# Patient Record
Sex: Female | Born: 1945 | Race: White | Marital: Married
Health system: Southern US, Community
[De-identification: ages and names within clinical notes are randomized; demographics above are authoritative.]

---

## 2017-03-27 ENCOUNTER — Other Ambulatory Visit: Payer: Self-pay | Admitting: Neurosurgery

## 2017-03-27 DIAGNOSIS — M48061 Spinal stenosis, lumbar region without neurogenic claudication: Secondary | ICD-10-CM

## 2017-04-11 ENCOUNTER — Other Ambulatory Visit: Payer: Self-pay

## 2017-04-13 ENCOUNTER — Ambulatory Visit
Admission: RE | Admit: 2017-04-13 | Discharge: 2017-04-13 | Disposition: A | Discharge status: Home / Self Care | Payer: Medicare Other | Source: Ambulatory Visit | Attending: Neurosurgery | Admitting: Neurosurgery

## 2017-04-13 DIAGNOSIS — M48061 Spinal stenosis, lumbar region without neurogenic claudication: Secondary | ICD-10-CM

## 2017-04-13 MED ORDER — ONDANSETRON HCL 4 MG/2ML IJ SOLN: 4.0000 mg | Freq: Four times a day (QID) | INTRAMUSCULAR | Status: DC | PRN

## 2017-04-13 MED ORDER — IOPAMIDOL (ISOVUE-M 200) INJECTION 41%
15.0000 mL | Freq: Once | INTRAMUSCULAR | Status: AC
  Administered 2017-04-13: 15 mL via INTRATHECAL

## 2017-04-13 MED ORDER — DIAZEPAM 5 MG PO TABS
5.0000 mg | ORAL_TABLET | Freq: Once | ORAL | Status: AC
  Administered 2017-04-13: 5 mg via ORAL

## 2017-04-13 NOTE — Discharge Instructions (Signed)
Myelogram Discharge Instructions  1. Go home and rest quietly for the next 24 hours.  It is important to lie flat for the next 24 hours.  Get up only to go to the restroom.  You may lie in the bed or on a couch on your back, your stomach, your left side or your right side.  You may have one pillow under your head.  You may have pillows between your knees while you are on your side or under your knees while you are on your back.  2. DO NOT drive today.  Recline the seat as far back as it will go, while still wearing your seat belt, on the way home.  3. You may get up to go to the bathroom as needed.  You may sit up for 10 minutes to eat.  You may resume your normal diet and medications unless otherwise indicated.  Drink lots of extra fluids today and tomorrow.  4. The incidence of headache, nausea, or vomiting is about 5% (one in 20 patients).  If you develop a headache, lie flat and drink plenty of fluids until the headache goes away.  Caffeinated beverages may be helpful.  If you develop severe nausea and vomiting or a headache that does not go away with flat bed rest, call [phone].  5. You may resume normal activities after your 24 hours of bed rest is over; however, do not exert yourself strongly or do any heavy lifting tomorrow. If when you get up you have a headache when standing, go back to bed and force fluids for another 24 hours.  6. Call your physician for a follow-up appointment.  The results of your myelogram will be sent directly to your physician by the following day.  7. If you have any questions or if complications develop after you arrive home, please call [phone].  Discharge instructions have been explained to the patient.  The patient, or the person responsible for the patient, fully understands these instructions.       May resume [facility] on April 14, 2017, after 1:00 pm.

## 2018-10-05 IMAGING — CT CT L SPINE W/ CM
1 of 6 series · 6 of 14 positions shown, 8 images · non-contrast
Comparison: Outside lumbar spine MRI 02/16/2017

CLINICAL DATA: Low back pain.
TECHNIQUE: Contiguous axial images were obtained through the Lumbar spine after
the intrathecal infusion of infusion. Coronal and sagittal
reconstructions were obtained of the axial image sets.

[Series 3: l spine soft · axial · 0.28mm/px · z∈[-290,-116]mm · 6 of 82 slices shown, 8 images]
[im 12/82  soft-tissue]
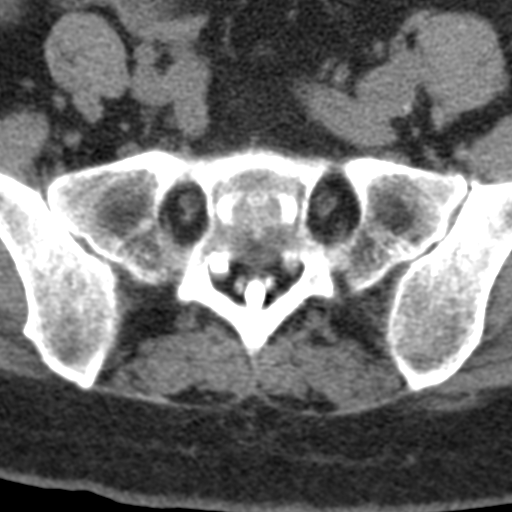
[im 12/82  bone]
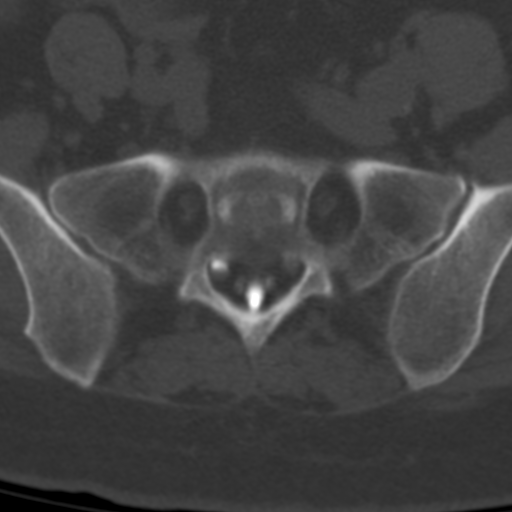
[im 24/82  bone]
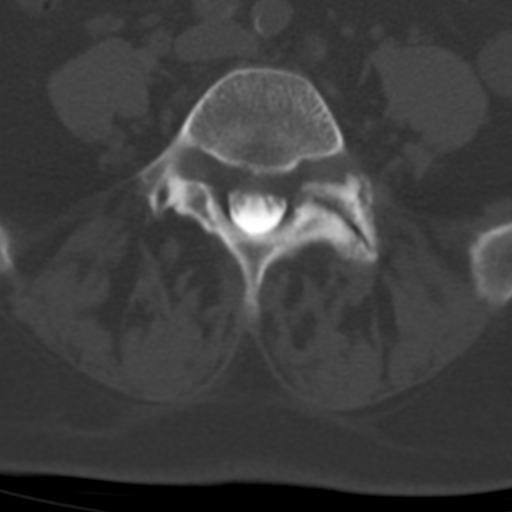
[im 35/82  bone]
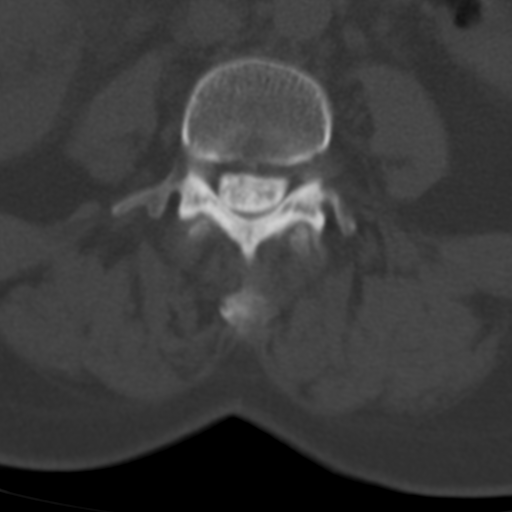
[im 47/82  bone]
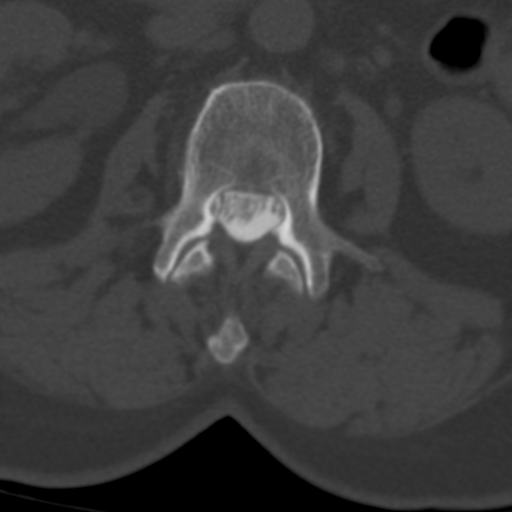
[im 58/82  soft-tissue]
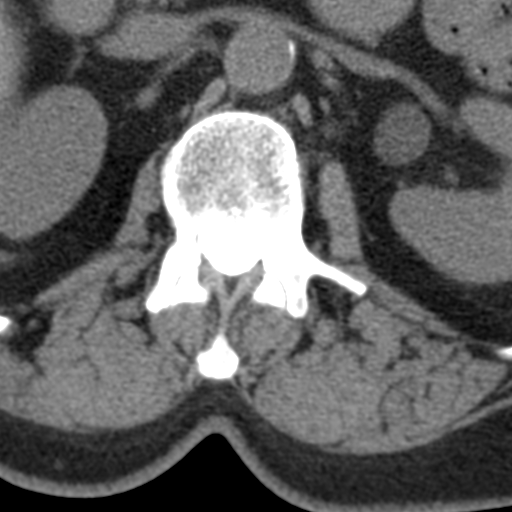
[im 58/82  bone]
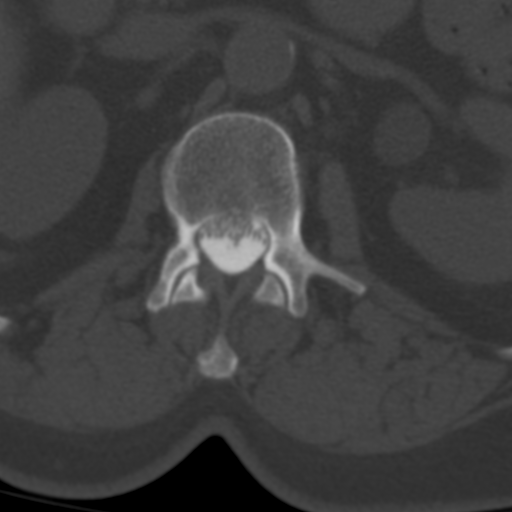
[im 70/82  bone]
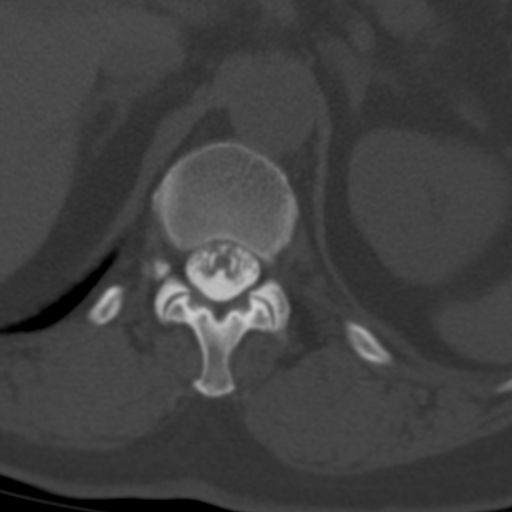

[6 of 14 positions shown; findings below may reference images not displayed]

EXAM:
LUMBAR MYELOGRAM

FLUOROSCOPY TIME:  Radiation Exposure Index (as provided by the
fluoroscopic device): 258.65 microGray*m^2

Fluoroscopy Time (in minutes and seconds):  20 seconds

PROCEDURE:
After thorough discussion of risks and benefits of the procedure
including bleeding, infection, injury to nerves, blood vessels,
adjacent structures as well as headache and CSF leak, written and
oral informed consent was obtained. Consent was obtained by Dr.
Griff Pierre Louis. Time out form was completed.

Patient was positioned prone on the fluoroscopy table. Local
anesthesia was provided with 1% lidocaine without epinephrine after
prepped and draped in the usual sterile fashion. Puncture was
performed at L4-5 using a 3 1/2 inch 22-gauge spinal needle via a
left interlaminar approach. Using a single pass through the dura,
the needle was placed within the thecal sac, with return of clear
CSF. 15 mL of Isovue-M 200 was injected into the thecal sac, with
normal opacification of the nerve roots and cauda equina consistent
with free flow within the subarachnoid space.

I personally performed the lumbar puncture and administered the
intrathecal contrast. I also personally supervised acquisition of
the myelogram images.
FINDINGS: LUMBAR MYELOGRAM FINDINGS:

Lumbar segmentation is normal. Vertebral alignment is near anatomic
in the sagittal plane without evidence of dynamic instability on
upright flexion or extension radiographs. A moderately large ventral
extradural defect is present at L3-4 with likely moderate spinal
stenosis as well as evidence of bilateral lateral recess narrowing.
A moderate-sized ventral extradural defect at L4-5 is minimally more
prominent with standing and extends behind the L4 vertebral body
corresponding to a known disc extrusion. There is no associated
high-grade spinal stenosis at this level. A small ventral extradural
defect is present at L2-3.

CT LUMBAR MYELOGRAM FINDINGS:

There is trace anterolisthesis of L5 on S1, and there is slight left
convex curvature of the lumbar spine. No fracture or destructive
osseous process is identified. Schmorl's nodes are again seen at
L3-4. The conus medullaris terminates at L1. Aortoiliac
atherosclerosis is noted.

T12-L1: Incidental opacified nerve root sleeve diverticulum in the
right neural foramen. No disc herniation or stenosis.

L1-2:  Negative.

L2-3: Mild disc space narrowing and vacuum disc. Circumferential
disc bulging and mild facet arthrosis without stenosis, unchanged.

L3-4: Moderate to severe disc space narrowing and vacuum disc.
Circumferential disc bulging and moderate facet and ligamentum
flavum hypertrophy result in moderate spinal stenosis, mild
bilateral lateral recess stenosis, and mild-to-moderate bilateral
neural foraminal stenosis, unchanged.

L4-5: Mild disc space narrowing and vacuum disc. Circumferential
disc bulging and moderate facet and ligamentum flavum hypertrophy
result in mild bilateral neural foraminal stenosis, unchanged. A
moderate-sized left paracentral disc extrusion with mild superior
migration focally indents the thecal sac and mildly deflects the
left L5 nerve roots without significant spinal or lateral recess
stenosis, unchanged.

L5-S1: Mild disc space narrowing and vacuum disc. Circumferential
disc bulging and severe facet arthrosis result in moderate to severe
right and moderate left neural foraminal stenosis potentially
affecting the L5 nerve roots, unchanged. No spinal stenosis.
IMPRESSION: 1. Moderate multifactorial spinal stenosis and mild-to-moderate
neural foraminal stenosis at L3-4.
2. Left paracentral disc extrusion at L4-5 without significant
spinal or lateral recess stenosis.
3. Moderate to severe right and moderate left neural foraminal
stenosis at L5-S1.
4.  Aortic Atherosclerosis ([id number]-[id number]).
